# Patient Record
Sex: Male | Born: 1985 | Hispanic: No | Marital: Single | Smoking: Former smoker
Health system: Southern US, Community
[De-identification: ages and names within clinical notes are randomized; demographics above are authoritative.]

---

## 2012-05-04 ENCOUNTER — Other Ambulatory Visit: Payer: Self-pay | Admitting: Family Medicine

## 2012-05-04 DIAGNOSIS — M25561 Pain in right knee: Secondary | ICD-10-CM

## 2012-05-11 ENCOUNTER — Inpatient Hospital Stay: Admission: RE | Admit: 2012-05-11 | Payer: Self-pay | Source: Ambulatory Visit

## 2013-09-25 ENCOUNTER — Emergency Department (HOSPITAL_COMMUNITY)
Admission: EM | Admit: 2013-09-25 | Discharge: 2013-09-25 | Disposition: A | Discharge status: Home / Self Care | Payer: BC Managed Care – PPO | Attending: Emergency Medicine | Admitting: Emergency Medicine

## 2013-09-25 ENCOUNTER — Encounter (HOSPITAL_COMMUNITY): Payer: Self-pay | Admitting: *Deleted

## 2013-09-25 DIAGNOSIS — R42 Dizziness and giddiness: Secondary | ICD-10-CM | POA: Insufficient documentation

## 2013-09-25 DIAGNOSIS — R52 Pain, unspecified: Secondary | ICD-10-CM | POA: Insufficient documentation

## 2013-09-25 DIAGNOSIS — R519 Headache, unspecified: Secondary | ICD-10-CM

## 2013-09-25 DIAGNOSIS — F17200 Nicotine dependence, unspecified, uncomplicated: Secondary | ICD-10-CM | POA: Insufficient documentation

## 2013-09-25 DIAGNOSIS — R51 Headache: Secondary | ICD-10-CM | POA: Insufficient documentation

## 2013-09-25 DIAGNOSIS — R509 Fever, unspecified: Secondary | ICD-10-CM | POA: Insufficient documentation

## 2013-09-25 MED ORDER — SODIUM CHLORIDE 0.9 % IV BOLUS (SEPSIS)
1000.0000 mL | Freq: Once | INTRAVENOUS | Status: AC
  Administered 2013-09-25: 1000 mL via INTRAVENOUS

## 2013-09-25 MED ORDER — ASPIRIN-ACETAMINOPHEN-CAFFEINE 250-250-65 MG PO TABS: 1.0000 | ORAL_TABLET | Freq: Four times a day (QID) | ORAL | Status: DC | PRN

## 2013-09-25 MED ORDER — KETOROLAC TROMETHAMINE 30 MG/ML IJ SOLN
30.0000 mg | Freq: Once | INTRAMUSCULAR | Status: AC
  Administered 2013-09-25: 30 mg via INTRAVENOUS
  Filled 2013-09-25: qty 1

## 2013-09-25 MED ORDER — DIPHENHYDRAMINE HCL 50 MG/ML IJ SOLN
25.0000 mg | Freq: Once | INTRAMUSCULAR | Status: AC
  Administered 2013-09-25: 25 mg via INTRAVENOUS
  Filled 2013-09-25: qty 1

## 2013-09-25 MED ORDER — DEXAMETHASONE SODIUM PHOSPHATE 10 MG/ML IJ SOLN
10.0000 mg | Freq: Once | INTRAMUSCULAR | Status: AC
  Administered 2013-09-25: 10 mg via INTRAVENOUS
  Filled 2013-09-25: qty 1

## 2013-09-25 NOTE — ED Provider Notes (Signed)
Medical screening examination/treatment/procedure(s) were performed by non-physician practitioner and as supervising physician I was immediately available for consultation/collaboration.   Gwyneth Sprout, MD 09/25/13 2311

## 2013-09-25 NOTE — ED Notes (Signed)
Pt reports having dizziness and generalized body pain x 3 days. Reports headache, fever. Denies any n/v/d.

## 2013-09-25 NOTE — ED Provider Notes (Signed)
CSN: [id number]     Arrival date & time 09/25/13  1829 History   First MD Initiated Contact with Patient 09/25/13 1949     Chief Complaint  Patient presents with  . Dizziness  . Headache   (Consider location/radiation/quality/duration/timing/severity/associated sxs/prior Treatment) HPI  She presents to the emergency department with complaints of headache for the past 3 days. Per triage note they say that the patient has had dizziness, generalized bodyaches, fever. Patient denies having dizziness, generalized body aches or fever. I asked him if he told them that at triage and he denies. He says he only has headache and informs me that he wishes not his first language. He declines need for interpreter he says he understands meter 5.  Headache is diffuse and throbbing with some mild photophobia. He denies having neck pain, nausea, vomiting, diarrhea, abdominal pain, weakness, confusion, history of headaches. His brother is with him and says he is acting at baseline. Has not tried anything at home for his headache. He describes the pain as a 5/10.  History reviewed. No pertinent past medical history. History reviewed. No pertinent past surgical history. History reviewed. No pertinent family history. History  Substance Use Topics  . Smoking status: Current Every Day Smoker    Types: Cigarettes  . Smokeless tobacco: Not on file  . Alcohol Use: No    Review of Systems  Neurological: Positive for headaches.  All other systems reviewed and are negative.    Allergies  Review of patient's allergies indicates no known allergies.  Home Medications   Current Outpatient Rx  Name  Route  Sig  Dispense  Refill  . FeAsp-FeFum -Suc-C-Thre-B12-FA (MULTIGEN PLUS PO)   Oral   Take 1 tablet by mouth daily.          BP 121/69  Pulse 77  Temp(Src) 98.3 F (36.8 C) (Oral)  Resp 20  SpO2 100% Physical Exam  Nursing note and vitals reviewed. Constitutional: He is oriented to person, place,  and time. He appears well-developed and well-nourished. No distress.  HENT:  Head: Normocephalic and atraumatic.  Eyes: Pupils are equal, round, and reactive to light.  Neck: Normal range of motion. Neck supple.  Cardiovascular: Normal rate and regular rhythm.   Pulmonary/Chest: Effort normal.  Abdominal: Soft.  Neurological: He is alert and oriented to person, place, and time. He has normal strength. No cranial nerve deficit or sensory deficit. He displays a negative Romberg sign. GCS eye subscore is 4. GCS verbal subscore is 5. GCS motor subscore is 6.  Skin: Skin is warm and dry.    ED Course  Procedures (including critical care time) Labs Review Labs Reviewed - No data to display Imaging Review No results found.  MDM   1. Headache      He shouldn't given 1 L IV normal saline, 30 mg IV Toradol and 25 mg IV Benadryl, and 10mg  IV Decadron. He says that his headache has completely resolved and he is feeling much better.   Recommend pt follow-up with his PCP or return to the ED if it returns or worsens. I do not believe imaging is needed at this time with normal neuro exam and resolution of headache with pain medication.  27 y.o.Brett Benson's evaluation in the Emergency Department is complete. It has been determined that no acute conditions requiring further emergency intervention are present at this time. The patient/guardian have been advised of the diagnosis and plan. We have discussed signs and symptoms that warrant return to the  ED, such as changes or worsening in symptoms.  Vital signs are stable at discharge. Filed Vitals:   09/25/13 2035  BP: 121/69  Pulse: 77  Temp:   Resp: 20    Patient/guardian has voiced understanding and agreed to follow-up with the PCP or specialist.   Dorthula Matas, PA-C 09/25/13 2122

## 2016-01-15 ENCOUNTER — Emergency Department (HOSPITAL_COMMUNITY)
Admission: EM | Admit: 2016-01-15 | Discharge: 2016-01-15 | Disposition: A | Discharge status: Home / Self Care | Payer: Self-pay | Attending: Emergency Medicine | Admitting: Emergency Medicine

## 2016-01-15 ENCOUNTER — Encounter (HOSPITAL_COMMUNITY): Payer: Self-pay | Admitting: Emergency Medicine

## 2016-01-15 ENCOUNTER — Emergency Department (HOSPITAL_COMMUNITY): Payer: Self-pay

## 2016-01-15 DIAGNOSIS — Z792 Long term (current) use of antibiotics: Secondary | ICD-10-CM | POA: Insufficient documentation

## 2016-01-15 DIAGNOSIS — J019 Acute sinusitis, unspecified: Secondary | ICD-10-CM | POA: Insufficient documentation

## 2016-01-15 DIAGNOSIS — F17210 Nicotine dependence, cigarettes, uncomplicated: Secondary | ICD-10-CM | POA: Insufficient documentation

## 2016-01-15 DIAGNOSIS — R079 Chest pain, unspecified: Secondary | ICD-10-CM | POA: Insufficient documentation

## 2016-01-15 LAB — CBC
HEMATOCRIT: 45 % (ref 39.0–52.0)
Hemoglobin: 15.4 g/dL (ref 13.0–17.0)
MCH: 29.1 pg (ref 26.0–34.0)
MCHC: 34.2 g/dL (ref 30.0–36.0)
MCV: 84.9 fL (ref 78.0–100.0)
PLATELETS: 244 10*3/uL (ref 150–400)
RBC: 5.3 MIL/uL (ref 4.22–5.81)
RDW: 12.1 % (ref 11.5–15.5)
WBC: 8.7 10*3/uL (ref 4.0–10.5)

## 2016-01-15 LAB — BASIC METABOLIC PANEL
Anion gap: 11 (ref 5–15)
BUN: 11 mg/dL (ref 6–20)
CHLORIDE: 102 mmol/L (ref 101–111)
CO2: 26 mmol/L (ref 22–32)
Calcium: 9.6 mg/dL (ref 8.9–10.3)
Creatinine, Ser: 1.01 mg/dL (ref 0.61–1.24)
Glucose, Bld: 117 mg/dL — ABNORMAL HIGH (ref 65–99)
POTASSIUM: 3.9 mmol/L (ref 3.5–5.1)
SODIUM: 139 mmol/L (ref 135–145)

## 2016-01-15 LAB — I-STAT TROPONIN, ED: Troponin i, poc: 0 ng/mL (ref 0.00–0.08)

## 2016-01-15 LAB — RAPID STREP SCREEN (MED CTR MEBANE ONLY): STREPTOCOCCUS, GROUP A SCREEN (DIRECT): NEGATIVE

## 2016-01-15 MED ORDER — AMOXICILLIN-POT CLAVULANATE 875-125 MG PO TABS
1.0000 | ORAL_TABLET | Freq: Once | ORAL | Status: AC
  Administered 2016-01-15: 1 via ORAL
  Filled 2016-01-15: qty 1

## 2016-01-15 MED ORDER — AMOXICILLIN-POT CLAVULANATE 875-125 MG PO TABS: 1.0000 | ORAL_TABLET | Freq: Two times a day (BID) | ORAL | Status: DC

## 2016-01-15 MED ORDER — IBUPROFEN 800 MG PO TABS
800.0000 mg | ORAL_TABLET | Freq: Once | ORAL | Status: AC
  Administered 2016-01-15: 800 mg via ORAL
  Filled 2016-01-15: qty 1

## 2016-01-15 MED ORDER — BENZONATATE 100 MG PO CAPS: 100.0000 mg | ORAL_CAPSULE | Freq: Three times a day (TID) | ORAL | Status: DC

## 2016-01-15 NOTE — ED Provider Notes (Signed)
CSN: [id number]     Arrival date & time 01/15/16  1156 History   First MD Initiated Contact with Patient 01/15/16 1502     Chief Complaint  Patient presents with  . Chest Pain  . Fever   (Consider location/radiation/quality/duration/timing/severity/associated sxs/prior Treatment) Patient is a 30 y.o. male presenting with chest pain and fever. The history is provided by the patient. No language interpreter was used.  Chest Pain Associated symptoms: cough, fever and shortness of breath   Associated symptoms: no abdominal pain, no nausea and not vomiting   Fever Associated symptoms: chest pain, chills, cough and myalgias   Associated symptoms: no nausea and no vomiting     Brett Benson is a 30 year old male with no significant past medical history who presents for a yellow sputum cough, body aches,  and subjective fever with chills for the past week. He also states he feels pressure in his face and has nasal congestion. He reports having constant chest pain since last night radiating to bilateral shoulders with a feeling of heaviness.  He is also complaining of shortness of breath but only at night when the coughing is worse. He states He traveled to [place] and [place] 2 weeks ago and was not sick during his trip to [place]. He became sick while in [place] with these cough and nasal congestion symptoms. He returned from his vacation 5 days ago. He reports taking ibuprofen daily with his last dose yesterday night. He also states that he has not been drinking much fluids and has had a 3 pound weight loss during his vacation.  He smokes 3 cigarettes a day. He denies any family history of cardiac disease.   History reviewed. No pertinent past medical history. History reviewed. No pertinent past surgical history. No family history on file. Social History  Substance Use Topics  . Smoking status: Current Every Day Smoker    Types: Cigarettes  . Smokeless tobacco: None  . Alcohol Use: No     Review of Systems  Constitutional: Positive for fever and chills.  Respiratory: Positive for cough and shortness of breath.   Cardiovascular: Positive for chest pain.  Gastrointestinal: Negative for nausea, vomiting and abdominal pain.  Musculoskeletal: Positive for myalgias. Negative for neck pain.  All other systems reviewed and are negative.     Allergies  Review of patient's allergies indicates no known allergies.  Home Medications   Prior to Admission medications   Medication Sig Start Date End Date Taking? Authorizing Provider  ibuprofen (ADVIL,MOTRIN) 200 MG tablet Take 200 mg by mouth every 6 (six) hours as needed for headache or moderate pain.   Yes Historical Provider, MD  amoxicillin-clavulanate (AUGMENTIN) 875-125 MG tablet Take 1 tablet by mouth every 12 (twelve) hours. 01/15/16   Tora Prunty Patel-Mills, PA-C  aspirin-acetaminophen-caffeine (EXCEDRIN MIGRAINE) [phone] MG per tablet Take 1 tablet by mouth every 6 (six) hours as needed for pain. 09/25/13   Tiffany Neva Seat, PA-C  benzonatate (TESSALON) 100 MG capsule Take 1 capsule (100 mg total) by mouth every 8 (eight) hours. 01/15/16   Brett Randazzo Patel-Mills, PA-C   BP 124/74 mmHg  Pulse 73  Temp(Src) 97.8 F (36.6 C) (Oral)  Resp 16  Ht 5\' 4"  (1.626 m)  Wt 72.576 kg  BMI 27.45 kg/m2  SpO2 98% Physical Exam  Constitutional: He is oriented to person, place, and time. Vital signs are normal. He appears well-developed and well-nourished. No distress.  Patient is well-nourished and resting comfortably. No respiratory distress. Afebrile.  HENT:  Head: Normocephalic and atraumatic.  Mouth/Throat: No oropharyngeal exudate.  Moist mucous membranes. Mild erythema but no tonsillar edema or exudates. Patent airway. No drooling or trismus. No hot potato voice. No kissing tonsils. No palpable sinus tenderness.  Eyes: Conjunctivae are normal.  Neck: Normal range of motion and full passive range of motion without pain. Neck supple.  No spinous process tenderness and no muscular tenderness present. No Brudzinski's sign and no Kernig's sign noted.  No anterior cervical lymphadenopathy. No signs of meningismus. Able to touch chin to chest without difficulty.  Cardiovascular: Normal rate, regular rhythm and normal heart sounds.  Exam reveals no gallop and no friction rub.   No murmur heard. Regular rate and rhythm. No murmurs, rubs, or gallops.  Pulmonary/Chest: Effort normal and breath sounds normal. No accessory muscle usage. No respiratory distress. He has no decreased breath sounds. He has no wheezes. He has no rales. He exhibits no tenderness.  Lungs clear to auscultation bilaterally. No wheezing or decreased breath sounds. No respiratory distress or use of accessory muscles. No reproducible chest tenderness.  Abdominal: Soft. He exhibits no distension. There is no tenderness. There is no rebound and no guarding.  Abdomen is soft and nontender.  Musculoskeletal: Normal range of motion. He exhibits no edema or tenderness.  No lower extremity edema or tenderness to palpation.  Lymphadenopathy:    He has no cervical adenopathy.  Neurological: He is alert and oriented to person, place, and time.  Skin: Skin is warm and dry.  No rash.  Psychiatric: He has a normal mood and affect.  Nursing note and vitals reviewed.   ED Course  Procedures (including critical care time) Labs Review Labs Reviewed  BASIC METABOLIC PANEL - Abnormal; Notable for the following:    Glucose, Bld 117 (*)    All other components within normal limits  RAPID STREP SCREEN (NOT AT [facility])  CULTURE, GROUP A STREP ([facility])  CBC  I-STAT TROPOININ, ED    Imaging Review Dg Chest 2 View  01/15/2016  CLINICAL DATA:  Productive cough 1 week ago, recent travel from foreign country EXAM: CHEST  2 VIEW COMPARISON:  None. FINDINGS: The heart size and mediastinal contours are within normal limits. Both lungs are clear. The visualized skeletal structures are  unremarkable. IMPRESSION: No active cardiopulmonary disease. Electronically Signed   By: Esperanza Heiraymond  Rubner M.D.   On: 01/15/2016 13:42   I have personally reviewed and evaluated these images and lab results as part of my medical decision-making.  ED ECG REPORT   Date: 01/15/2016  Rate: 81   Rhythm: normal sinus rhythm  QRS Axis: normal  Intervals: normal  ST/T Wave abnormalities: normal  Conduction Disutrbances:none  Narrative Interpretation:   Old EKG Reviewed: none available  I have personally reviewed the EKG tracing and agree with the computerized printout as noted.   MDM   Final diagnoses:  Acute sinusitis, recurrence not specified, unspecified location  Chest pain, unspecified chest pain type   Patient presents for URI and sinusitis symptoms x 1 week with cough, head pressure and nasal congestion. He also complaining of atypical chest pain with sob at night.   He [place]. PERC negative. No calf tenderness or swelling.  No sob on exam and 100%oxygen on RA.  I reviewed the heart score and he is at low risk for ACS or major cardiac event.  Do not believe the patient needs repeat troponin since this chest pain started yesterday. Labs are unremarkable. Troponin  negative. EKG not concerning. Strep negative. This is most likely sinusitis or an upper respiratory infection. He was treated with Augmentin and tessalon. I told him to take ibuprofen for body aches. Return precautions are thoroughly discussed. Patient remained afebrile and stable while in the ED. He was given the resource guide to find a provider. Patient agrees with plan. Medications  amoxicillin-clavulanate (AUGMENTIN) 875-125 MG per tablet 1 tablet (1 tablet Oral Given 01/15/16 1646)  ibuprofen (ADVIL,MOTRIN) tablet 800 mg (800 mg Oral Given 01/15/16 1646)   Filed Vitals:   01/15/16 1700 01/15/16 1704  BP: 124/94 124/74  Pulse: 70 73  Temp:  97.8 F (36.6 C)  Resp: 19 [id number] PA-C 01/15/16 2343  Leta Baptist, MD 01/24/16 2127

## 2016-01-15 NOTE — ED Notes (Signed)
Patient physically moved from hallway bed to room A11; patient undressed, in gown, on monitor, continuous pulse oximetry and blood pressure cuff

## 2016-01-15 NOTE — ED Notes (Signed)
Onset one week ago developed productive cough with yellow sputum states feels like he has a fever and chest pain one day ago.  Travelled from [place] and returned to [place] 5 days ago.

## 2016-01-15 NOTE — Discharge Instructions (Signed)
Sinusitis, Adult °Sinusitis is redness, soreness, and inflammation of the paranasal sinuses. Paranasal sinuses are air pockets within the bones of your face. They are located beneath your eyes, in the middle of your forehead, and above your eyes. In healthy paranasal sinuses, mucus is able to drain out, and air is able to circulate through them by way of your nose. However, when your paranasal sinuses are inflamed, mucus and air can become trapped. This can allow bacteria and other germs to grow and cause infection. °Sinusitis can develop quickly and last only a short time (acute) or continue over a long period (chronic). Sinusitis that lasts for more than 12 weeks is considered chronic. °CAUSES °Causes of sinusitis include: °· Allergies. °· Structural abnormalities, such as displacement of the cartilage that separates your nostrils (deviated septum), which can decrease the air flow through your nose and sinuses and affect sinus drainage. °· Functional abnormalities, such as when the small hairs (cilia) that line your sinuses and help remove mucus do not work properly or are not present. °SIGNS AND SYMPTOMS °Symptoms of acute and chronic sinusitis are the same. The primary symptoms are pain and pressure around the affected sinuses. Other symptoms include: °· Upper toothache. °· Earache. °· Headache. °· Bad breath. °· Decreased sense of smell and taste. °· A cough, which worsens when you are lying flat. °· Fatigue. °· Fever. °· Thick drainage from your nose, which often is green and may contain pus (purulent). °· Swelling and warmth over the affected sinuses. °DIAGNOSIS °Your health care provider will perform a physical exam. During your exam, your health care provider may perform any of the following to help determine if you have acute sinusitis or chronic sinusitis: °· Look in your nose for signs of abnormal growths in your nostrils (nasal polyps). °· Tap over the affected sinus to check for signs of  infection. °· View the inside of your sinuses using an imaging device that has a light attached (endoscope). °If your health care provider suspects that you have chronic sinusitis, one or more of the following tests may be recommended: °· Allergy tests. °· Nasal culture. A sample of mucus is taken from your nose, sent to a lab, and screened for bacteria. °· Nasal cytology. A sample of mucus is taken from your nose and examined by your health care provider to determine if your sinusitis is related to an allergy. °TREATMENT °Most cases of acute sinusitis are related to a viral infection and will resolve on their own within 10 days. Sometimes, medicines are prescribed to help relieve symptoms of both acute and chronic sinusitis. These may include pain medicines, decongestants, nasal steroid sprays, or saline sprays. °However, for sinusitis related to a bacterial infection, your health care provider will prescribe antibiotic medicines. These are medicines that will help kill the bacteria causing the infection. °Rarely, sinusitis is caused by a fungal infection. In these cases, your health care provider will prescribe antifungal medicine. °For some cases of chronic sinusitis, surgery is needed. Generally, these are cases in which sinusitis recurs more than 3 times per year, despite other treatments. °HOME CARE INSTRUCTIONS °· Drink plenty of water. Water helps thin the mucus so your sinuses can drain more easily. °· Use a humidifier. °· Inhale steam 3-4 times a day (for example, sit in the bathroom with the shower running). °· Apply a warm, moist washcloth to your face 3-4 times a day, or as directed by your health care provider. °· Use saline nasal sprays to help   moisten and clean your sinuses.  Take medicines only as directed by your health care provider.  If you were prescribed either an antibiotic or antifungal medicine, finish it all even if you start to feel better. SEEK IMMEDIATE MEDICAL CARE IF:  You have  increasing pain or severe headaches.  You have nausea, vomiting, or drowsiness.  You have swelling around your face.  You have vision problems.  You have a stiff neck.  You have difficulty breathing.   This information is not intended to replace advice given to you by your health care provider. Make sure you discuss any questions you have with your health care provider.   Document Released: 12/16/2005 Document Revised: 01/06/2015 Document Reviewed: 12/31/2011 Elsevier Interactive Patient Education 2016 Elsevier Inc.  Nonspecific Chest Pain It is often hard to find the cause of chest pain. There is always a chance that your pain could be related to something serious, such as a heart attack or a blood clot in your lungs. Chest pain can also be caused by conditions that are not life-threatening. If you have chest pain, it is very important to follow up with your doctor.  HOME CARE  If you were prescribed an antibiotic medicine, finish it all even if you start to feel better.  Avoid any activities that cause chest pain.  Do not use any tobacco products, including cigarettes, chewing tobacco, or electronic cigarettes. If you need help quitting, ask your doctor.  Do not drink alcohol.  Take medicines only as told by your doctor.  Keep all follow-up visits as told by your doctor. This is important. This includes any further testing if your chest pain does not go away.  Your doctor may tell you to keep your head raised (elevated) while you sleep.  Make lifestyle changes as told by your doctor. These may include:  Getting regular exercise. Ask your doctor to suggest some activities that are safe for you.  Eating a heart-healthy diet. Your doctor or a diet specialist (dietitian) can help you to learn healthy eating options.  Maintaining a healthy weight.  Managing diabetes, if necessary.  Reducing stress. GET HELP IF:  Your chest pain does not go away, even after  treatment.  You have a rash with blisters on your chest.  You have a fever. GET HELP RIGHT AWAY IF:  Your chest pain is worse.  You have an increasing cough, or you cough up blood.  You have severe belly (abdominal) pain.  You feel extremely weak.  You pass out (faint).  You have chills.  You have sudden, unexplained chest discomfort.  You have sudden, unexplained discomfort in your arms, back, neck, or jaw.  You have shortness of breath at any time.  You suddenly start to sweat, or your skin gets clammy.  You feel nauseous.  You vomit.  You suddenly feel light-headed or dizzy.  Your heart begins to beat quickly, or it feels like it is skipping beats. These symptoms may be an emergency. Do not wait to see if the symptoms will go away. Get medical help right away. Call your local emergency services (911 in the [place] Do not drive yourself to the hospital.   This information is not intended to replace advice given to you by your health care provider. Make sure you discuss any questions you have with your health care provider.   Document Released: 06/03/2008 Document Revised: 01/06/2015 Document Reviewed: 07/22/2014 Elsevier Interactive Patient Education 2016 [employer].  Emergency Department Resource Guide 1)  Find a Doctor and Pay Out of Pocket Although you won't have to find out who is covered by your insurance plan, it is a good idea to ask around and get recommendations. You will then need to call the office and see if the doctor you have chosen will accept you as a new patient and what types of options they offer for patients who are self-pay. Some doctors offer discounts or will set up payment plans for their patients who do not have insurance, but you will need to ask so you aren't surprised when you get to your appointment.  2) Contact Your Local Health Department Not all health departments have doctors that can see patients for sick visits, but many do, so it is  worth a call to see if yours does. If you don't know where your local health department is, you can check in your phone book. The CDC also has a tool to help you locate your state's health department, and many state websites also have listings of all of their local health departments.  3) Find a Walk-in Clinic If your illness is not likely to be very severe or complicated, you may want to try a walk in clinic. These are popping up all over the country in pharmacies, drugstores, and shopping centers. They're usually staffed by nurse practitioners or physician assistants that have been trained to treat common illnesses and complaints. They're usually fairly quick and inexpensive. However, if you have serious medical issues or chronic medical problems, these are probably not your best option.  No Primary Care Doctor: - Call Health Connect at  [phone] - they can help you locate a primary care doctor that  accepts your insurance, provides certain services, etc. - Physician Referral Service- [phone]  Chronic Pain Problems: Organization         Address  Phone   Notes  [facility]  [phone] Patients need to be referred by their primary care doctor.   Medication Assistance: Organization         Address  Phone   Notes  [facility] Medication [facility] [address] [place], [place] [address] [phone] --Must be a resident of [facility] -- Must have NO insurance coverage whatsoever (no Medicaid/ Medicare, etc.) -- The pt. MUST have a primary care doctor that directs their care regularly and follows them in the community   [facility]  [phone]   [employer]  [phone]    Agencies that provide inexpensive medical care: Organization         Address  Phone   Notes  [facility]  [phone]   [facility]    [phone]   [facility] [address] [place],  [place] [address] [phone]   [facility] [address] [place] [phone]   Planned Parenthood    [phone]   [facility]    [phone]   [facility]  [address], [place] Phone:  [phone], Fax:  [phone] Hours of Operation:  9 am - 6 pm, M-F.  Also accepts Medicaid/Medicare and self-pay.  [facility]  [address], [place] Phone: [phone], Fax: [phone]. Hours of Operation:  8:30 am - 5:30 pm, M-F.  Also accepts Medicaid and self-pay.  [facility] [place] [address], [address]  Phone: [phone]   [facility] [address] [place], [place] [phone], Ext. [phone] Mondays & Thursdays: 7-9 AM.  First 15 patients are seen on a first come, first serve basis.    Medicaid-accepting [facility] Providers:  Organization         Address  Phone   Notes  [facility] [address], [place] [phone] Also accepts self-pay patients.  [facility] [address] Laurell Josephs [place], [place]  [phone]   [facility] [address], [place] [phone]   [facility] [address], [place] [phone]   Renaye Rakers [address], [place]   [phone] Only accepts [place] patients after they have their name applied to their card.   Self-Pay (no insurance) in [facility]:  Organization         Address  Phone   Notes  Sickle Cell Patients, [facility] Internal Medicine [address] [place], [place] [phone]   [facility] Urgent Care [address] [place], [place] [phone]   [facility]  [address], [place] [phone]   [facility]/Dr. Osei-Bonsu  [address], [place] or [address], [place] [phone] Phone number for both [place] and [place] locations is the same.  Urgent Medical and [facility] [address] [place] [phone]   [facility] [address] [place] or [address] [phone]   [facility] [address], [place] [phone], phone; [phone], fax Sees patients 1st and 3rd Saturday of every month.  Must not qualify for public or private insurance (i.e. Medicaid, Medicare, [place] Health Choice, Veterans' Benefits)  Household income should be no more than 200% of the poverty level The clinic cannot treat you if you are pregnant or think you are pregnant  Sexually transmitted diseases are not treated at the clinic.    Dental Care: Organization         Address  Phone  Notes  [facility] Department of [facility] [address] [place], [place] [phone] Accepts children up to age 95 who are enrolled in [place] or [place] Health Choice; pregnant women with a Medicaid card; and children who have applied for Medicaid or [place] Health Choice, but were declined, whose parents can pay a reduced fee at time of service.  [facility] Department of [facility]  [address], [place] [phone] Accepts children up to age 22 who are enrolled in [place] or [place] Health Choice; pregnant women with a Medicaid card; and children who have applied for Medicaid or [place] Health Choice, but were declined, whose parents can pay a reduced fee at time of service.  [facility]  [address] [place], [place] [phone] Patients are seen by appointment only. Walk-ins are not accepted. [facility] will see patients 37 years of age and older. Monday - Tuesday (8am-5pm) Most Wednesdays (8:30-5pm) $30 per visit, cash only  [facility]  [address], [facility] [phone]  Patients are seen by appointment only. Walk-ins are not accepted. [facility] will see patients 31 years of age and older. One Wednesday Evening (Monthly: Volunteer  Based).  $30 per visit, cash only  [facility]  [phone] for adults; Children under age 324, call Graduate Pediatric Dentistry at [phone]) [phone]. Children aged 924-14, please call [phone] to request a pediatric application.  Dental services are provided in all areas of dental care including fillings, crowns and bridges, complete and partial dentures, implants, gum treatment, root canals, and extractions. Preventive care is also provided. Treatment is provided to both adults and children. Patients are selected via a lottery and there is often a waiting list.   [facility] [address], [place]  [phone] [email or web address]   [facility] [address], [place], [place] [phone], Ext. [phone] Second and Fourth Thursday of each month, opens at 6:30 AM; Clinic ends at 9 AM.  Patients are seen on a first-come first-served basis, and a limited number are seen during each clinic.   [facility]  [address], [place], [place] [phone]) [phone]   Eligibility Requirements You must have lived in [place], [place], or [place] counties for at least the last three months.   You cannot be eligible for state or federal sponsored [place]healthcare insurance, including CIGNAVeterans Administration, [place]icaid, or [employer]icare.   You generally cannot be eligible for healthcare insurance through your employer.    How to apply: Eligibility screenings are held every Tuesday and Wednesday afternoon from 1:00 pm until 4:00 pm. You do not need an appointment for the interview!  [facility] [address], [place], [place] [phone]   [facility]  [phone]   [facility]  [phone]   [facility]   [phone]    Behavioral Health Resources in the Community: Intensive Outpatient Programs Organization         Address  Phone  Notes  [facility] [address], [place], [place] [phone]   [facility] [address], [place], [place] [phone]   [facility] [address], [place], [place]  [phone]   [facility] [address],  [place], [place] [phone] or [phone]   Substance Abuse Resources Organization         Address  Phone  Notes  [facility]  [phone]   [facility]  [phone]   [facility]  [phone]   [facility]  [phone]   Residential & Outpatient Substance Abuse Program  [phone]   Psychological Services Organization         Address  Phone  Notes  [facility]  [phone]   [facility]  [phone]- [phone]   [facility] [address], [place] [phone] or [phone]    Mobile Crisis Teams Organization         Address  Phone  Notes  [facility], Mobile Crisis Care Unit  [phone]   [facility]  [address] [place], [place] [phone]   Doristine LocksSharon DeEsch [address] [place] [phone]    Self-Help/Support Groups Organization         Address  Phone             Notes  [facility] [place] - variety of support groups  336- [phone] Call for more information  Narcotics Anonymous (NA), [facility] [address], [place]   2 meetings at this location   [facility] Treatment Programs  Organization         Address  Phone  Notes  [facility] [address]    [place]  [phone]   [facility]  [address] [place] [address], [place], [place] [phone]   [facility] [address] [place], [place] [phone] Admissions: 8am-3pm M-F   [facility] [address],    [place], [place] [phone]   [facility] [address] [place], [place], [place] [phone]   [facility] [address],  [place], [place] [phone]   [facility] - Intensive Outpatient [address]ure[address] Joseph[address], [place], [place] [phone]   [facility] [address]  [place], [place] [phone] or [phone]   [facility] ([facility]) [address] [place], [place] [phone] Accepts Medicaid  [facility] [address]  [place] [phone] Substance Abuse/Addiction Treatment   [facility] Organization         Address  Phone  Notes  [facility]  [phone]   Angie Fava, PhD [address] Ervin Knack [place], [place]   [phone] or [phone]   [facility] Behavioral   [address] [place], [place] [phone]   [facility] [address], [place], [place] [phone] Insurance/Medicaid/sponsorship through [facility] and Families [address]                                    [place], [place] [phone] Therapy/tele-psych/case  [facility] [address], [place] [phone]    Dr. Lolly Mustache  [phone]   [facility] [place]  [employer] [facility] Dept. 1) [address], [place] 2) [address], [place] 3)  [address], [place] [phone]  [phone]   [facility] Child Abuse Hotline [phone] or [phone] (After Hours)

## 2016-01-15 NOTE — ED Notes (Signed)
Pt home after verbalized un derstanding of instructions

## 2016-01-15 NOTE — ED Notes (Signed)
Pt taking po fluids and tolerating well. 

## 2016-01-18 LAB — CULTURE, GROUP A STREP (THRC)

## 2016-10-18 ENCOUNTER — Emergency Department (HOSPITAL_BASED_OUTPATIENT_CLINIC_OR_DEPARTMENT_OTHER)
Admission: EM | Admit: 2016-10-18 | Discharge: 2016-10-18 | Disposition: A | Discharge status: Home / Self Care | Payer: Self-pay | Attending: Emergency Medicine | Admitting: Emergency Medicine

## 2016-10-18 ENCOUNTER — Encounter (HOSPITAL_BASED_OUTPATIENT_CLINIC_OR_DEPARTMENT_OTHER): Payer: Self-pay | Admitting: Emergency Medicine

## 2016-10-18 ENCOUNTER — Emergency Department (HOSPITAL_BASED_OUTPATIENT_CLINIC_OR_DEPARTMENT_OTHER): Payer: Self-pay

## 2016-10-18 DIAGNOSIS — Z7982 Long term (current) use of aspirin: Secondary | ICD-10-CM | POA: Insufficient documentation

## 2016-10-18 DIAGNOSIS — J40 Bronchitis, not specified as acute or chronic: Secondary | ICD-10-CM | POA: Insufficient documentation

## 2016-10-18 DIAGNOSIS — Z791 Long term (current) use of non-steroidal anti-inflammatories (NSAID): Secondary | ICD-10-CM | POA: Insufficient documentation

## 2016-10-18 DIAGNOSIS — Z87891 Personal history of nicotine dependence: Secondary | ICD-10-CM | POA: Insufficient documentation

## 2016-10-18 LAB — BASIC METABOLIC PANEL
Anion gap: 8 (ref 5–15)
BUN: 10 mg/dL (ref 6–20)
CALCIUM: 9.7 mg/dL (ref 8.9–10.3)
CO2: 29 mmol/L (ref 22–32)
Chloride: 101 mmol/L (ref 101–111)
Creatinine, Ser: 1.18 mg/dL (ref 0.61–1.24)
GFR calc Af Amer: >60 mL/min (ref >60)
GLUCOSE: 104 mg/dL — AB (ref 65–99)
POTASSIUM: 3.8 mmol/L (ref 3.5–5.1)
Sodium: 138 mmol/L (ref 135–145)

## 2016-10-18 LAB — CBC WITH DIFFERENTIAL/PLATELET
BASOS ABS: 0 10*3/uL (ref 0.0–0.1)
Basophils Relative: 0 %
EOS PCT: 1 %
Eosinophils Absolute: 0.1 10*3/uL (ref 0.0–0.7)
HCT: 45.7 % (ref 39.0–52.0)
Hemoglobin: 15.5 g/dL (ref 13.0–17.0)
LYMPHS PCT: 8 %
Lymphs Abs: 0.6 10*3/uL — ABNORMAL LOW (ref 0.7–4.0)
MCH: 29.3 pg (ref 26.0–34.0)
MCHC: 33.9 g/dL (ref 30.0–36.0)
MCV: 86.4 fL (ref 78.0–100.0)
MONO ABS: 0.9 10*3/uL (ref 0.1–1.0)
MONOS PCT: 12 %
Neutro Abs: 5.9 10*3/uL (ref 1.7–7.7)
Neutrophils Relative %: 79 %
PLATELETS: 194 10*3/uL (ref 150–400)
RBC: 5.29 MIL/uL (ref 4.22–5.81)
RDW: 12 % (ref 11.5–15.5)
WBC: 7.4 10*3/uL (ref 4.0–10.5)

## 2016-10-18 LAB — TROPONIN I

## 2016-10-18 LAB — RAPID STREP SCREEN (MED CTR MEBANE ONLY): STREPTOCOCCUS, GROUP A SCREEN (DIRECT): NEGATIVE

## 2016-10-18 LAB — D-DIMER, QUANTITATIVE: D-Dimer, Quant: <0.27 ug/mL-FEU (ref 0.00–0.50)

## 2016-10-18 MED ORDER — ONDANSETRON HCL 4 MG/2ML IJ SOLN
4.0000 mg | Freq: Once | INTRAMUSCULAR | Status: AC
  Administered 2016-10-18: 4 mg via INTRAVENOUS
  Filled 2016-10-18: qty 2

## 2016-10-18 MED ORDER — SODIUM CHLORIDE 0.9 % IV BOLUS (SEPSIS)
1000.0000 mL | Freq: Once | INTRAVENOUS | Status: AC
  Administered 2016-10-18: 1000 mL via INTRAVENOUS

## 2016-10-18 MED ORDER — NAPROXEN 500 MG PO TABS: 500.0000 mg | ORAL_TABLET | Freq: Two times a day (BID) | ORAL | 0 refills | Status: DC

## 2016-10-18 MED ORDER — IBUPROFEN 800 MG PO TABS
800.0000 mg | ORAL_TABLET | Freq: Once | ORAL | Status: AC
  Administered 2016-10-18: 800 mg via ORAL
  Filled 2016-10-18: qty 1

## 2016-10-18 MED ORDER — AZITHROMYCIN 250 MG PO TABS
250.0000 mg | ORAL_TABLET | Freq: Every day | ORAL | 0 refills | Status: DC
Start: 2016-10-18 — End: 2018-11-29

## 2016-10-18 MED ORDER — ACETAMINOPHEN 500 MG PO TABS
1000.0000 mg | ORAL_TABLET | Freq: Once | ORAL | Status: AC
  Administered 2016-10-18: 1000 mg via ORAL
  Filled 2016-10-18: qty 2

## 2016-10-18 MED FILL — AZITHROMYCIN 250 MG TABLET: 250 | 5 days supply | Qty: 6 | Fill #0

## 2016-10-18 MED FILL — NAPROXEN 500 MG TABLET: 500 | 15 days supply | Qty: 30 | Fill #0

## 2016-10-18 NOTE — ED Provider Notes (Signed)
[facility] Provider Note   CSN: [id number] Arrival date & time: 10/18/16  4098     History   Chief Complaint Chief Complaint  Patient presents with  . Sore Throat  . Cough    HPI Brett Benson is a 30 y.o. male.  HPI  Brett Benson is a 30 y.o. male with No significant past medical history who presents with 2 week history of sudden onset, constant, worsening left-sided chest pain that radiated to his upper back he describes as pins and needles. Associated symptoms include cough, shortness of breath, chills, nasal congestion, sore throat, decreased appetite, and myalgias. He has taken ibuprofen with some relief. He denies any other medical problems. He denies any cardiac issues. He denies any history of DVT/PE, recent surgery, or prolonged immobilization. He states he did travel from [place] in September, and states that since he's been home he has not felt right. He denies sick contacts.  History reviewed. No pertinent past medical history.  There are no active problems to display for this patient.   History reviewed. No pertinent surgical history.     Home Medications    Prior to Admission medications   Medication Sig Start Date End Date Taking? Authorizing Provider  ibuprofen (ADVIL,MOTRIN) 200 MG tablet Take 800 mg by mouth every 6 (six) hours as needed for headache or moderate pain.    Yes Historical Provider, MD  amoxicillin-clavulanate (AUGMENTIN) 875-125 MG tablet Take 1 tablet by mouth every 12 (twelve) hours. 01/15/16   Hanna Patel-Mills, PA-C  aspirin-acetaminophen-caffeine (EXCEDRIN MIGRAINE) [phone] MG per tablet Take 1 tablet by mouth every 6 (six) hours as needed for pain. 09/25/13   Tiffany Neva Seat, PA-C  benzonatate (TESSALON) 100 MG capsule Take 1 capsule (100 mg total) by mouth every 8 (eight) hours. 01/15/16   Catha Gosselin, PA-C    Family History No family history on file.  Social History Social History  Substance Use  Topics  . Smoking status: Former Smoker    Types: Cigarettes    Quit date: 10/04/2016  . Smokeless tobacco: Never Used  . Alcohol use No     Allergies   Review of patient's allergies indicates no known allergies.   Review of Systems Review of Systems All other systems negative unless otherwise stated in HPI   Physical Exam Updated Vital Signs BP 119/65 (BP Location: Right Arm)   Pulse 90   Temp 99.7 F (37.6 C) (Oral)   Resp 18   Ht 5\' 5"  (1.651 m)   Wt 72.6 kg   SpO2 100%   BMI 26.63 kg/m   Physical Exam  Constitutional: He is oriented to person, place, and time. He appears well-developed and well-nourished. He is active.  Non-toxic appearance. He does not have a sickly appearance. He does not appear ill.  HENT:  Head: Normocephalic and atraumatic.  Right Ear: Tympanic membrane and external ear normal. Tympanic membrane is not erythematous and not bulging.  Left Ear: Tympanic membrane and external ear normal. Tympanic membrane is not erythematous and not bulging.  Nose: Nose normal.  Mouth/Throat: Uvula is midline, oropharynx is clear and moist and mucous membranes are normal. No trismus in the jaw. No uvula swelling. No oropharyngeal exudate, posterior oropharyngeal edema, posterior oropharyngeal erythema or tonsillar abscesses.  Neck: Normal range of motion. Neck supple.  No nuchal rigidity.   Cardiovascular: Normal rate and regular rhythm.   No unilateral leg swelling.  Pulmonary/Chest: Effort normal and breath sounds normal. No respiratory distress. He has  no wheezes. He has no rales.  Abdominal: Soft. Bowel sounds are normal. He exhibits no distension. There is no tenderness.  Musculoskeletal: Normal range of motion.  Lymphadenopathy:    He has no cervical adenopathy.  Neurological: He is alert and oriented to person, place, and time.  Skin: Skin is warm and dry.  Psychiatric: He has a normal mood and affect. His behavior is normal.     ED Treatments /  Results  Labs (all labs ordered are listed, but only abnormal results are displayed) Labs Reviewed  BASIC METABOLIC PANEL - Abnormal; Notable for the following:       Result Value   Glucose, Bld 104 (*)    All other components within normal limits  CBC WITH DIFFERENTIAL/PLATELET - Abnormal; Notable for the following:    Lymphs Abs 0.6 (*)    All other components within normal limits  RAPID STREP SCREEN (NOT AT [facility])  CULTURE, GROUP A STREP [facility])  TROPONIN I  D-DIMER, QUANTITATIVE (NOT AT [facility])    EKG  EKG Interpretation  Date/Time:  Friday October 18 2016 10:44:40 EDT Ventricular Rate:  97 PR Interval:    QRS Duration: 71 QT Interval:  318 QTC Calculation: 404 R Axis:   32 Text Interpretation:  Sinus rhythm Left atrial enlargement No significant change since last tracing Confirmed by Anitra Lauth  MD, Alphonzo Lemmings ([id number]) on 10/18/2016 10:52:35 AM Also confirmed by Anitra Lauth  MD, WHITNEY ([id number]), editor [place]  CCT, BEVERLY ([id number])  on 10/18/2016 10:56:28 AM       Radiology Dg Chest 2 View  Result Date: 10/18/2016 CLINICAL DATA:  Cough, congestion, fever for 2 weeks EXAM: CHEST  2 VIEW COMPARISON:  01/15/2016 FINDINGS: The heart size and mediastinal contours are within normal limits. Both lungs are clear. The visualized skeletal structures are unremarkable. IMPRESSION: No active cardiopulmonary disease. Electronically Signed   By: Elige Ko   On: 10/18/2016 10:20    Procedures Procedures (including critical care time)  Medications Ordered in ED Medications  sodium chloride 0.9 % bolus 1,000 mL (1,000 mLs Intravenous New Bag/Given 10/18/16 1119)  acetaminophen (TYLENOL) tablet 1,000 mg (1,000 mg Oral Given 10/18/16 1059)  ibuprofen (ADVIL,MOTRIN) tablet 800 mg (800 mg Oral Given 10/18/16 1059)  ondansetron (ZOFRAN) injection 4 mg (4 mg Intravenous Given 10/18/16 1118)     Initial Impression / Assessment and Plan / ED Course  I have reviewed the triage vital signs and  the nursing notes.  Pertinent labs & imaging results that were available during my care of the patient were reviewed by me and considered in my medical decision making (see chart for details).  Clinical Course   Patient presents with 2 weeks of sudden onset, constant, worsening left sided chest pain that radiates to his back. Described as sharp and like pins and needles. Associated symptoms include shortness of breath, cough, sore throat, and decreased appetite. Vitals reassuring. On exam, heart sounds normal, lungs clear to auscultation bilaterally, abdomen soft and benign. HENT exam unremarkable. No unilateral leg swelling. Will obtain EKG, chest x-ray, rapid strep and labs. Will give fluids, ibuprofen, Tylenol.  Low suspicion for cardiac etiology. Given recent travel history and then following sudden onset chest pain will obtain d-dimer.  EKG without ischemia, CXR clear, troponin negative.  Single troponin sufficient, given 2 weeks of constant chest pain.  No metabolic derangements.  Normal white count. Dimer negative.  Possible bronchitis.  Will treat with Azithromycin and Naproxen.  Follow up with [facility].  Return precautions discussed.  Stable for discharge.    Final Clinical Impressions(s) / ED Diagnoses   Final diagnoses:  Bronchitis    New Prescriptions New Prescriptions   No medications on file     Cheri FowlerKayla Kahmya Pinkham, Cordelia Poche-C 10/18/16 1223    Gwyneth SproutWhitney Plunkett, MD 10/20/16 2126

## 2016-10-18 NOTE — ED Triage Notes (Signed)
Cough, congestion and fever for 2 weeks. [place]

## 2016-10-18 NOTE — ED Notes (Signed)
Pt tolerated fluids without issue.  

## 2016-10-19 LAB — CULTURE, GROUP A STREP (THRC)

## 2016-10-20 ENCOUNTER — Telehealth (HOSPITAL_BASED_OUTPATIENT_CLINIC_OR_DEPARTMENT_OTHER): Payer: Self-pay

## 2016-10-20 NOTE — Telephone Encounter (Signed)
Post ED Visit - Positive Culture Follow-up  Culture report reviewed by antimicrobial stewardship pharmacist:  []  Brett Benson, Pharm.D. []  Brett Benson, Pharm.D., BCPS []  Brett Benson, Pharm.D. []  Brett Benson, Pharm.D., BCPS [x]  Brett Benson, [address]Pharm[address]D., BCPS, AAHIVP []  Brett Benson, Pharm.D., BCPS, AAHIVP []  Brett Benson, Pharm.D. []  Brett Benson, [place]D.  Positive Strep culture Treated with Azithromycin, organism sensitive to the same and no further patient follow-up is required at this time.  Brett Benson, Brett Benson 10/20/2016, 11:11 AM

## 2018-11-29 ENCOUNTER — Other Ambulatory Visit: Payer: Self-pay

## 2018-11-29 ENCOUNTER — Emergency Department (HOSPITAL_COMMUNITY)
Admission: EM | Admit: 2018-11-29 | Discharge: 2018-11-29 | Disposition: A | Discharge status: Home / Self Care | Payer: Self-pay | Attending: Emergency Medicine | Admitting: Emergency Medicine

## 2018-11-29 ENCOUNTER — Encounter (HOSPITAL_COMMUNITY): Payer: Self-pay | Admitting: Emergency Medicine

## 2018-11-29 ENCOUNTER — Emergency Department (HOSPITAL_COMMUNITY): Payer: Self-pay

## 2018-11-29 DIAGNOSIS — Y939 Activity, unspecified: Secondary | ICD-10-CM | POA: Insufficient documentation

## 2018-11-29 DIAGNOSIS — S161XXA Strain of muscle, fascia and tendon at neck level, initial encounter: Secondary | ICD-10-CM | POA: Insufficient documentation

## 2018-11-29 DIAGNOSIS — Y998 Other external cause status: Secondary | ICD-10-CM | POA: Insufficient documentation

## 2018-11-29 DIAGNOSIS — Y92410 Unspecified street and highway as the place of occurrence of the external cause: Secondary | ICD-10-CM | POA: Insufficient documentation

## 2018-11-29 DIAGNOSIS — Z87891 Personal history of nicotine dependence: Secondary | ICD-10-CM | POA: Insufficient documentation

## 2018-11-29 DIAGNOSIS — S0003XA Contusion of scalp, initial encounter: Secondary | ICD-10-CM | POA: Insufficient documentation

## 2018-11-29 MED ORDER — NAPROXEN 500 MG PO TABS: 500.0000 mg | ORAL_TABLET | Freq: Two times a day (BID) | ORAL | 0 refills | Status: DC

## 2018-11-29 MED ORDER — CYCLOBENZAPRINE HCL 10 MG PO TABS: 10.0000 mg | ORAL_TABLET | Freq: Three times a day (TID) | ORAL | 0 refills | Status: DC | PRN
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
## 2018-11-29 NOTE — Discharge Instructions (Signed)
Apply ice for 30 minutes at a time, 4 times a day.  Take naproxen twice a day.  Take cyclobenzaprine as often as 3 times a day as needed for muscle spasms.  For additional pain relief, take acetaminophen.
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
## 2018-11-29 NOTE — ED Notes (Addendum)
To x-ray

## 2019-09-09 ENCOUNTER — Other Ambulatory Visit: Payer: Self-pay

## 2019-09-09 DIAGNOSIS — Z20822 Contact with and (suspected) exposure to covid-19: Secondary | ICD-10-CM

## 2019-09-11 LAB — NOVEL CORONAVIRUS, NAA: SARS-CoV-2, NAA: NOT DETECTED

## 2019-10-13 ENCOUNTER — Other Ambulatory Visit: Payer: Self-pay

## 2019-10-13 DIAGNOSIS — Z20822 Contact with and (suspected) exposure to covid-19: Secondary | ICD-10-CM

## 2019-10-15 LAB — NOVEL CORONAVIRUS, NAA: SARS-CoV-2, NAA: NOT DETECTED

## 2019-10-18 ENCOUNTER — Other Ambulatory Visit: Payer: Self-pay

## 2019-10-18 DIAGNOSIS — Z20822 Contact with and (suspected) exposure to covid-19: Secondary | ICD-10-CM

## 2019-10-20 LAB — NOVEL CORONAVIRUS, NAA: SARS-CoV-2, NAA: NOT DETECTED

## 2020-12-12 ENCOUNTER — Other Ambulatory Visit: Payer: Self-pay | Admitting: Internal Medicine

## 2020-12-13 LAB — SARS-COV-2 RNA,(COVID-19) QUALITATIVE NAAT: SARS CoV2 RNA: NOT DETECTED

## 2021-05-15 ENCOUNTER — Emergency Department (HOSPITAL_COMMUNITY)
Admission: EM | Admit: 2021-05-15 | Discharge: 2021-05-16 | Disposition: A | Discharge status: Home / Self Care | Payer: No Typology Code available for payment source | Attending: Emergency Medicine | Admitting: Emergency Medicine

## 2021-05-15 ENCOUNTER — Emergency Department (HOSPITAL_COMMUNITY): Payer: No Typology Code available for payment source

## 2021-05-15 DIAGNOSIS — R519 Headache, unspecified: Secondary | ICD-10-CM | POA: Diagnosis not present

## 2021-05-15 DIAGNOSIS — S5002XA Contusion of left elbow, initial encounter: Secondary | ICD-10-CM | POA: Insufficient documentation

## 2021-05-15 DIAGNOSIS — Y92410 Unspecified street and highway as the place of occurrence of the external cause: Secondary | ICD-10-CM | POA: Insufficient documentation

## 2021-05-15 DIAGNOSIS — S8991XA Unspecified injury of right lower leg, initial encounter: Secondary | ICD-10-CM | POA: Diagnosis present

## 2021-05-15 DIAGNOSIS — Z87891 Personal history of nicotine dependence: Secondary | ICD-10-CM | POA: Diagnosis not present

## 2021-05-15 DIAGNOSIS — R0789 Other chest pain: Secondary | ICD-10-CM | POA: Insufficient documentation

## 2021-05-15 DIAGNOSIS — S8001XA Contusion of right knee, initial encounter: Secondary | ICD-10-CM | POA: Diagnosis not present

## 2021-05-15 DIAGNOSIS — S60212A Contusion of left wrist, initial encounter: Secondary | ICD-10-CM | POA: Insufficient documentation
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
## 2021-05-16 ENCOUNTER — Emergency Department (HOSPITAL_COMMUNITY): Payer: No Typology Code available for payment source

## 2021-05-16 MED ORDER — IBUPROFEN 200 MG PO TABS
400.0000 mg | ORAL_TABLET | Freq: Once | ORAL | Status: AC
  Administered 2021-05-16: 400 mg via ORAL
  Filled 2021-05-16: qty 2

## 2021-05-16 MED ORDER — ALBUTEROL SULFATE HFA 108 (90 BASE) MCG/ACT IN AERS
2.0000 | INHALATION_SPRAY | Freq: Once | RESPIRATORY_TRACT | Status: AC
  Administered 2021-05-16: 2 via RESPIRATORY_TRACT
  Filled 2021-05-16: qty 6.7

## 2021-05-16 NOTE — Discharge Instructions (Signed)
Apply ice to sore areas.  Ice to be applied for 30 minutes at a time, as often as 4 times a day.  Take ibuprofen or naproxen as needed for pain.  If you need additional pain relief, you may add acetaminophen.  You may continue using the inhaler - two puffs every 4 hours as needed.

## 2021-05-16 NOTE — ED Notes (Signed)
Back from Xray.

## 2021-05-16 NOTE — ED Notes (Signed)
Patient is with Xray
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
## 2023-08-07 ENCOUNTER — Other Ambulatory Visit: Payer: Self-pay | Admitting: Internal Medicine
